# Patient Record
Sex: Female | Born: 1990 | Race: Black or African American | Hispanic: No | Marital: Single | Smoking: Never smoker
Health system: Southern US, Community
[De-identification: ages and names within clinical notes are randomized; demographics above are authoritative.]

## PROBLEM LIST (undated history)

## (undated) DIAGNOSIS — J45909 Unspecified asthma, uncomplicated: Secondary | ICD-10-CM

---
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
## 2009-01-17 ENCOUNTER — Emergency Department: Payer: Self-pay | Admitting: Emergency Medicine

## 2010-01-29 ENCOUNTER — Emergency Department: Payer: Self-pay | Admitting: Emergency Medicine

## 2010-05-29 ENCOUNTER — Emergency Department: Payer: Self-pay | Admitting: Physician Assistant

## 2011-03-03 LAB — URINALYSIS, ROUTINE W REFLEX MICROSCOPIC
Bilirubin Urine: NEGATIVE
Glucose, UA: NEGATIVE
Specific Gravity, Urine: 1.025
Urobilinogen, UA: 2 — ABNORMAL HIGH

## 2011-03-03 LAB — BASIC METABOLIC PANEL
CO2: 25
Calcium: 8.8
Creatinine, Ser: 0.82
GFR calc Af Amer: >60
GFR calc non Af Amer: >60
Sodium: 136

## 2011-03-03 LAB — DIFFERENTIAL
Basophils Relative: 2 — ABNORMAL HIGH
Lymphocytes Relative: 35
Lymphs Abs: 2
Monocytes Absolute: 0.4
Monocytes Relative: 7
Neutro Abs: 3
Neutrophils Relative %: 52

## 2011-03-03 LAB — CBC
Hemoglobin: 12.7
MCHC: 34.2
RBC: 4.22
WBC: 5.7

## 2011-03-03 LAB — WET PREP, GENITAL
Trich, Wet Prep: NONE SEEN
Yeast Wet Prep HPF POC: NONE SEEN

## 2011-03-03 LAB — URINE MICROSCOPIC-ADD ON

## 2011-03-03 LAB — GC/CHLAMYDIA PROBE AMP, GENITAL: Chlamydia, DNA Probe: NEGATIVE

## 2011-08-21 ENCOUNTER — Emergency Department: Payer: Self-pay | Admitting: Internal Medicine

## 2011-08-21 LAB — COMPREHENSIVE METABOLIC PANEL
Alkaline Phosphatase: 40 U/L — ABNORMAL LOW (ref 50–136)
BUN: 12 mg/dL (ref 7–18)
Bilirubin,Total: 0.2 mg/dL (ref 0.2–1.0)
Creatinine: 0.98 mg/dL (ref 0.60–1.30)
EGFR (African American): >60
EGFR (Non-African Amer.): >60
Glucose: 77 mg/dL (ref 65–99)
SGOT(AST): 18 U/L (ref 15–37)
SGPT (ALT): 12 U/L
Sodium: 141 mmol/L (ref 136–145)
Total Protein: 7.3 g/dL (ref 6.4–8.2)

## 2011-08-21 LAB — CBC
HGB: 12.6 g/dL (ref 12.0–16.0)
MCHC: 34.9 g/dL (ref 32.0–36.0)
MCV: 89 fL (ref 80–100)

## 2011-08-21 LAB — URINALYSIS, COMPLETE
Bilirubin,UR: NEGATIVE
Leukocyte Esterase: NEGATIVE
Nitrite: NEGATIVE
Ph: 5 (ref 4.5–8.0)
WBC UR: 2 /HPF (ref 0–5)

## 2011-08-21 LAB — PREGNANCY, URINE: Pregnancy Test, Urine: POSITIVE m[IU]/mL

## 2011-08-31 ENCOUNTER — Emergency Department: Payer: Self-pay | Admitting: Emergency Medicine

## 2011-08-31 LAB — CBC
HGB: 12.9 g/dL (ref 12.0–16.0)
MCH: 30.8 pg (ref 26.0–34.0)
MCHC: 34.8 g/dL (ref 32.0–36.0)
MCV: 89 fL (ref 80–100)
Platelet: 204 10*3/uL (ref 150–440)
RBC: 4.18 10*6/uL (ref 3.80–5.20)
RDW: 13.9 % (ref 11.5–14.5)

## 2011-08-31 LAB — URINALYSIS, COMPLETE
Bacteria: NONE SEEN
Leukocyte Esterase: NEGATIVE
Ph: 5 (ref 4.5–8.0)
RBC,UR: 1 /HPF (ref 0–5)
Squamous Epithelial: 1
WBC UR: 3 /HPF (ref 0–5)

## 2011-08-31 LAB — COMPREHENSIVE METABOLIC PANEL
Alkaline Phosphatase: 36 U/L — ABNORMAL LOW (ref 50–136)
Anion Gap: 12 (ref 7–16)
BUN: 7 mg/dL (ref 7–18)
Calcium, Total: 8.8 mg/dL (ref 8.5–10.1)
Creatinine: 0.55 mg/dL — ABNORMAL LOW (ref 0.60–1.30)
EGFR (African American): >60
Glucose: 72 mg/dL (ref 65–99)
Osmolality: 276 (ref 275–301)
Sodium: 140 mmol/L (ref 136–145)

## 2011-08-31 LAB — WET PREP, GENITAL

## 2012-12-02 ENCOUNTER — Encounter (HOSPITAL_COMMUNITY): Payer: Self-pay | Admitting: Emergency Medicine

## 2012-12-02 DIAGNOSIS — J45909 Unspecified asthma, uncomplicated: Secondary | ICD-10-CM | POA: Insufficient documentation

## 2012-12-02 DIAGNOSIS — F17200 Nicotine dependence, unspecified, uncomplicated: Secondary | ICD-10-CM | POA: Insufficient documentation

## 2012-12-02 DIAGNOSIS — L259 Unspecified contact dermatitis, unspecified cause: Secondary | ICD-10-CM | POA: Insufficient documentation

## 2012-12-02 NOTE — ED Notes (Signed)
Patient c/o rash to face since Friday; states has been using Benadryl and Claritin without relief.  No shortness of breath.

## 2012-12-03 ENCOUNTER — Emergency Department (HOSPITAL_COMMUNITY)
Admission: EM | Admit: 2012-12-03 | Discharge: 2012-12-03 | Disposition: A | Discharge status: Home / Self Care | Payer: Self-pay | Attending: Emergency Medicine | Admitting: Emergency Medicine

## 2012-12-03 DIAGNOSIS — L309 Dermatitis, unspecified: Secondary | ICD-10-CM

## 2012-12-03 HISTORY — DX: Unspecified asthma, uncomplicated: J45.909

## 2012-12-03 MED ORDER — PREDNISONE 10 MG PO TABS: ORAL_TABLET | ORAL | Status: DC

## 2012-12-03 MED ORDER — PREDNISONE 50 MG PO TABS
60.0000 mg | ORAL_TABLET | Freq: Once | ORAL | Status: AC
  Administered 2012-12-03: 60 mg via ORAL
  Filled 2012-12-03: qty 1

## 2012-12-03 MED ORDER — DIPHENHYDRAMINE HCL 25 MG PO CAPS
25.0000 mg | ORAL_CAPSULE | Freq: Once | ORAL | Status: AC
  Administered 2012-12-03: 25 mg via ORAL
  Filled 2012-12-03: qty 1

## 2012-12-03 MED ORDER — DIPHENHYDRAMINE HCL 25 MG PO CAPS: 25.0000 mg | ORAL_CAPSULE | ORAL | Status: DC | PRN

## 2012-12-03 NOTE — ED Provider Notes (Signed)
History    CSN: [id number] Arrival date & time 12/02/12  2331  First MD Initiated Contact with Patient 12/03/12 0110     Chief Complaint  Patient presents with  . Rash   (Consider location/radiation/quality/duration/timing/severity/associated sxs/prior Treatment) HPI Comments: Kaitlyn Henry is a 22 y.o. Female presenting with a 4 day history of an itchy periorbital rash.  She denies any new facial products stating she has very sensitive skin and has to be careful, including does not use makeup due to sensitivity.  Her rash started shortly after eating a steak biscuit at work, although has had this meal many times before without incident.  She denies fevers, chills,  Shortness of breath and has had no facial , mouth or throat swelling.   She has used topical benadryl and cortaid both which caused severe burning. She has found no alleviators.     The history is provided by the patient.   Past Medical History  Diagnosis Date  . Asthma    History reviewed. No pertinent past surgical history. No family history on file. History  Substance Use Topics  . Smoking status: Current Some Day Smoker  . Smokeless tobacco: Not on file  . Alcohol Use: Yes   OB History   Grav Para Term Preterm Abortions TAB SAB Ect Mult Living                 Review of Systems  Constitutional: Negative for fever and chills.  HENT: Negative for sore throat, facial swelling, mouth sores and trouble swallowing.   Respiratory: Negative for shortness of breath and wheezing.   Skin: Positive for rash.  Neurological: Negative for numbness.    Allergies  Review of patient's allergies indicates no known allergies.  Home Medications   Current Outpatient Rx  Name  Route  Sig  Dispense  Refill  . diphenhydrAMINE (BENADRYL) 25 mg capsule   Oral   Take 1 capsule (25 mg total) by mouth every 4 (four) hours as needed for itching.   30 capsule   0   . predniSONE (DELTASONE) 10 MG tablet      6, 5, 4, 3, 2  then 1 tablet by mouth daily for 6 days total.   21 tablet   0    BP 108/70  Pulse 112  Temp(Src) 98.3 F (36.8 C) (Oral)  Resp 20  Ht 5\' 4"  (1.626 m)  Wt 150 lb (68.04 kg)  BMI 25.73 kg/m2  SpO2 100%  LMP 11/01/2012 Physical Exam  Constitutional: She appears well-developed and well-nourished. No distress.  HENT:  Head: Normocephalic.  Neck: Neck supple.  Cardiovascular: Normal rate.   Pulmonary/Chest: Effort normal. She has no wheezes.  Musculoskeletal: Normal range of motion. She exhibits no edema.  Skin: Rash noted.  Dry appearing skin on chin,  Periorbital area and right cheek,  Macular,  A few areas with tiny grouped vesicles.  No erythema or edema.    ED Course  Procedures (including critical care time) Labs Reviewed - No data to display No results found. 1. Dermatitis     MDM  Probable contact dermatitis of unclear causative agent.  Pt was prescribed oral prednisone,  Suggested oral benadryl for itch as topicals caused burning.  Encouraged f/u with dermatology if sx persist,  Referral given.  The patient appears reasonably screened and/or stabilized for discharge and I doubt any other medical condition or other [facility] requiring further screening, evaluation, or treatment in the ED at this time prior to  discharge.   Burgess Amor, PA-C 12/03/12 [phone]

## 2012-12-03 NOTE — ED Provider Notes (Signed)
Medical screening examination/treatment/procedure(s) were performed by non-physician practitioner and as supervising physician I was immediately available for consultation/collaboration.  Karena Kinker S. Lillyanna Glandon, MD 12/03/12 0437 

## 2012-12-03 NOTE — ED Notes (Signed)
Patient has fine rash noted to facial area. Denies pain. Denies difficulty breathing. States it started 3 days ago.

## 2013-10-02 ENCOUNTER — Emergency Department (HOSPITAL_COMMUNITY)
Admission: EM | Admit: 2013-10-02 | Discharge: 2013-10-02 | Disposition: A | Discharge status: Home / Self Care | Payer: Self-pay | Attending: Emergency Medicine | Admitting: Emergency Medicine

## 2013-10-02 ENCOUNTER — Emergency Department (HOSPITAL_COMMUNITY): Payer: Self-pay

## 2013-10-02 ENCOUNTER — Encounter (HOSPITAL_COMMUNITY): Payer: Self-pay | Admitting: Emergency Medicine

## 2013-10-02 DIAGNOSIS — J45909 Unspecified asthma, uncomplicated: Secondary | ICD-10-CM | POA: Insufficient documentation

## 2013-10-02 DIAGNOSIS — Z87891 Personal history of nicotine dependence: Secondary | ICD-10-CM | POA: Insufficient documentation

## 2013-10-02 DIAGNOSIS — S8390XA Sprain of unspecified site of unspecified knee, initial encounter: Secondary | ICD-10-CM

## 2013-10-02 DIAGNOSIS — Y929 Unspecified place or not applicable: Secondary | ICD-10-CM | POA: Insufficient documentation

## 2013-10-02 DIAGNOSIS — W010XXA Fall on same level from slipping, tripping and stumbling without subsequent striking against object, initial encounter: Secondary | ICD-10-CM | POA: Insufficient documentation

## 2013-10-02 DIAGNOSIS — Y939 Activity, unspecified: Secondary | ICD-10-CM | POA: Insufficient documentation

## 2013-10-02 DIAGNOSIS — IMO0002 Reserved for concepts with insufficient information to code with codable children: Secondary | ICD-10-CM | POA: Insufficient documentation

## 2013-10-02 MED ORDER — NAPROXEN 500 MG PO TABS: 500.0000 mg | ORAL_TABLET | Freq: Two times a day (BID) | ORAL | Status: DC

## 2013-10-02 MED ORDER — HYDROCODONE-ACETAMINOPHEN 5-325 MG PO TABS: ORAL_TABLET | ORAL | Status: DC

## 2013-10-02 NOTE — ED Provider Notes (Signed)
CSN: [id number]     Arrival date & time 10/02/13  [id number] History   First MD Initiated Contact with Patient 10/02/13 [phone]     Chief Complaint  Patient presents with  . Knee Pain     (Consider location/radiation/quality/duration/timing/severity/associated sxs/prior Treatment) Patient is a 23 y.o. female presenting with knee pain. The history is provided by the patient.  Knee Pain Location:  Knee Time since incident:  2 days Injury: yes   Mechanism of injury: fall   Fall:    Fall occurred:  Tripped   Impact surface:  Hard floor   Point of impact:  Unable to specify   Entrapped after fall: no   Knee location:  R knee Pain details:    Quality:  Tingling and sharp   Radiates to:  Does not radiate   Severity:  Moderate   Onset quality:  Sudden   Timing:  Constant   Progression:  Unchanged Chronicity:  New Dislocation: no   Foreign body present:  No foreign bodies Prior injury to area:  No Relieved by:  Nothing Worsened by:  Activity and bearing weight Ineffective treatments:  NSAIDs Associated symptoms: no back pain, no decreased ROM, no fever, no neck pain, no numbness, no stiffness, no swelling and no tingling     Past Medical History  Diagnosis Date  . Asthma    History reviewed. No pertinent past surgical history. No family history on file. History  Substance Use Topics  . Smoking status: Former Games developermoker  . Smokeless tobacco: Not on file  . Alcohol Use: No   OB History   Grav Para Term Preterm Abortions TAB SAB Ect Mult Living                 Review of Systems  Constitutional: Negative for fever and chills.  Genitourinary: Negative for dysuria and difficulty urinating.  Musculoskeletal: Positive for arthralgias. Negative for back pain, joint swelling, neck pain and stiffness.       Right knee pain  Skin: Negative for color change and wound.  All other systems reviewed and are negative.     Allergies  Review of patient's allergies indicates no known  allergies.  Home Medications   Prior to Admission medications   Medication Sig Start Date End Date Taking? Authorizing Provider  diphenhydrAMINE (BENADRYL) 25 mg capsule Take 1 capsule (25 mg total) by mouth every 4 (four) hours as needed for itching. 12/03/12   Burgess AmorJulie Idol, PA-C  predniSONE (DELTASONE) 10 MG tablet 6, 5, 4, 3, 2 then 1 tablet by mouth daily for 6 days total. 12/03/12   Burgess AmorJulie Idol, PA-C   BP 103/77  Pulse 82  Temp(Src) 98.2 F (36.8 C)  Resp 16  Ht 5\' 5"  (1.651 m)  Wt 145 lb (65.772 kg)  BMI 24.13 kg/m2  SpO2 96%  LMP 09/18/2013 Physical Exam  Nursing note and vitals reviewed. Constitutional: She is oriented to person, place, and time. She appears well-developed and well-nourished. No distress.  HENT:  Head: Normocephalic and atraumatic.  Neck: Normal range of motion. Neck supple.  Cardiovascular: Normal rate, regular rhythm, normal heart sounds and intact distal pulses.   Pulmonary/Chest: Effort normal and breath sounds normal. No respiratory distress.  Musculoskeletal: Normal range of motion. She exhibits tenderness. She exhibits no edema.  ttp of the distal right knee.  No erythema, effusion, or step-off deformity.  DP pulse brisk, distal sensation intact. Calf is soft and NT.  Compartments soft  Neurological: She is alert and oriented  to person, place, and time. She exhibits normal muscle tone. Coordination normal.  Skin: Skin is warm and dry. No erythema.    ED Course  Procedures (including critical care time) Labs Review Labs Reviewed - No data to display  Imaging Review Dg Knee Complete 4 Views Right  10/02/2013   CLINICAL DATA:  Fall.  EXAM: RIGHT KNEE - COMPLETE 4+ VIEW  COMPARISON:  None.  FINDINGS: No acute bony or joint abnormality identified. No evidence of fracture or dislocation.  IMPRESSION: No acute abnormality.   Electronically Signed   By: Maisie Fushomas  Register   On: 10/02/2013 09:13     EKG Interpretation None      MDM   Final diagnoses:   Knee sprain    XR reviewed, no concerning sx's for septic joint.  No effusion or STS of the knee.  Pt has full ROM of the knee.  She agrees to symptomatic treatment with RICE therapy and close orthopedic f/u in one week if sx's not improving.   Pt appears stable for discharge and agrees to plan.      Malachi Kinzler L. Trisha Mangleriplett, PA-C 10/03/13 1647

## 2013-10-02 NOTE — ED Notes (Addendum)
PT d/c to home with NAD. Right knee 19" immobilizer fitted and placed on pt with medium crutches with demonstration instructions and teach back used.

## 2013-10-02 NOTE — ED Notes (Addendum)
PT c/o right knee pain into calf from fall 2 days ago. No redness/swelling or deformity noted. Pt ambulated from triage to room.

## 2013-10-06 NOTE — ED Provider Notes (Signed)
Medical screening examination/treatment/procedure(s) were performed by non-physician practitioner and as supervising physician I was immediately available for consultation/collaboration.   EKG Interpretation None        Benny LennertJoseph L Charnell Peplinski, MD 10/06/13 1513

## 2014-02-21 ENCOUNTER — Encounter (HOSPITAL_COMMUNITY): Payer: Self-pay | Admitting: Emergency Medicine

## 2014-02-21 ENCOUNTER — Emergency Department (HOSPITAL_COMMUNITY)
Admission: EM | Admit: 2014-02-21 | Discharge: 2014-02-21 | Disposition: A | Discharge status: Home / Self Care | Payer: Self-pay | Attending: Emergency Medicine | Admitting: Emergency Medicine

## 2014-02-21 ENCOUNTER — Emergency Department (HOSPITAL_COMMUNITY): Payer: Self-pay

## 2014-02-21 DIAGNOSIS — M25519 Pain in unspecified shoulder: Secondary | ICD-10-CM | POA: Insufficient documentation

## 2014-02-21 DIAGNOSIS — R52 Pain, unspecified: Secondary | ICD-10-CM | POA: Insufficient documentation

## 2014-02-21 DIAGNOSIS — Z87891 Personal history of nicotine dependence: Secondary | ICD-10-CM | POA: Insufficient documentation

## 2014-02-21 DIAGNOSIS — Z791 Long term (current) use of non-steroidal anti-inflammatories (NSAID): Secondary | ICD-10-CM | POA: Insufficient documentation

## 2014-02-21 DIAGNOSIS — M25511 Pain in right shoulder: Secondary | ICD-10-CM

## 2014-02-21 DIAGNOSIS — J45909 Unspecified asthma, uncomplicated: Secondary | ICD-10-CM | POA: Insufficient documentation

## 2014-02-21 MED ORDER — TRAMADOL HCL 50 MG PO TABS: 50.0000 mg | ORAL_TABLET | Freq: Four times a day (QID) | ORAL | Status: DC | PRN

## 2014-02-21 MED ORDER — NAPROXEN 375 MG PO TABS: 375.0000 mg | ORAL_TABLET | Freq: Two times a day (BID) | ORAL | Status: DC

## 2014-02-21 MED ORDER — CYCLOBENZAPRINE HCL 10 MG PO TABS
10.0000 mg | ORAL_TABLET | Freq: Once | ORAL | Status: AC
  Administered 2014-02-21: 10 mg via ORAL
  Filled 2014-02-21: qty 1

## 2014-02-21 NOTE — ED Notes (Signed)
Pt reports right shoulder pain that radiates down the right arm. Pt states she unsure of any injury.

## 2014-02-21 NOTE — Discharge Instructions (Signed)
Your x-ray today normal. Take the medication as directed. Do not take the narcotic if you are driving because it will make you sleepy.

## 2014-02-21 NOTE — ED Provider Notes (Signed)
CSN: [id number]     Arrival date & time 02/21/14  1325 History   First MD Initiated Contact with Patient 02/21/14 1405     Chief Complaint  Patient presents with  . Shoulder Pain     (Consider location/radiation/quality/duration/timing/severity/associated sxs/prior Treatment) Patient is a 23 y.o. female presenting with shoulder pain. The history is provided by the patient.  Shoulder Pain This is a new problem. The current episode started yesterday. The problem occurs constantly. The problem has been gradually worsening. Exacerbated by: movement of the arm. She has tried NSAIDs for the symptoms. The treatment provided mild relief.   SYMPHANIE CEDERBERG is a 23 y.o. female who presents to the ED with right should pain that started yesterday while she was at work. She states she works at [employer] but is required to do other task that involves lifting up to 50 pounds. She does remember lifting the large container of tea prior to having the pain.  Past Medical History  Diagnosis Date  . Asthma    History reviewed. No pertinent past surgical history. No family history on file. History  Substance Use Topics  . Smoking status: Former Games developer  . Smokeless tobacco: Not on file  . Alcohol Use: No   OB History   Grav Para Term Preterm Abortions TAB SAB Ect Mult Living                 Review of Systems Negative except as stated in HPI   Allergies  Review of patient's allergies indicates no known allergies.  Home Medications   Prior to Admission medications   Medication Sig Start Date End Date Taking? Authorizing Provider  ibuprofen (ADVIL,MOTRIN) 200 MG tablet Take 600 mg by mouth every 8 (eight) hours as needed for moderate pain.    Yes Historical Provider, MD   BP 110/72  Pulse 76  Temp(Src) 98.7 F (37.1 C) (Oral)  Resp 16  Wt 140 lb (63.504 kg)  SpO2 100%  LMP 01/07/2014 Physical Exam  Nursing note and vitals reviewed. Constitutional: She is oriented to person,  place, and time. She appears well-developed and well-nourished. No distress.  HENT:  Head: Normocephalic and atraumatic.  Eyes: EOM are normal.  Neck: Neck supple.  Cardiovascular: Normal rate.   Pulmonary/Chest: Effort normal.  Abdominal: Soft. There is no tenderness.  Musculoskeletal:       Right shoulder: She exhibits tenderness. She exhibits normal range of motion, no swelling, no effusion, no crepitus, no deformity, no laceration, no spasm, normal pulse and normal strength.  Pain to the anterior aspect of the right shoulder with palpation and butting arm behind back.   Neurological: She is alert and oriented to person, place, and time. No cranial nerve deficit.  Skin: Skin is warm and dry.  Psychiatric: She has a normal mood and affect. Her behavior is normal.    ED Course  Procedures Dg Shoulder Right  02/21/2014   CLINICAL DATA:  Right arm tingling sensation. Possible pulled muscle. No known injury.  EXAM: RIGHT SHOULDER - 2+ VIEW  COMPARISON:  None.  FINDINGS: There is no evidence of fracture or dislocation. There is no evidence of arthropathy or other focal bone abnormality. Soft tissues are unremarkable.  IMPRESSION: Normal examination.   Electronically Signed   By: Gordan Payment M.D.   On: 02/21/2014 14:54    MDM  23 y.o. female with right shoulder pain after lifting heavy tea container at work. Stable for discharge without neurovascular deficits. Discussed  with the patient clinical and x-ray findings and all questioned fully answered. She will follow up with ortho or return here if any problems arise.   Medication List    TAKE these medications       naproxen 375 MG tablet  Commonly known as:  NAPROSYN  Take 1 tablet (375 mg total) by mouth 2 (two) times daily.     traMADol 50 MG tablet  Commonly known as:  ULTRAM  Take 1 tablet (50 mg total) by mouth every 6 (six) hours as needed.      ASK your doctor about these medications       ibuprofen 200 MG tablet  Commonly  known as:  ADVIL,MOTRIN  Take 600 mg by mouth every 8 (eight) hours as needed for moderate pain.           [place], [place] 02/22/14 2150

## 2014-02-23 NOTE — ED Provider Notes (Signed)
Medical screening examination/treatment/procedure(s) were performed by non-physician practitioner and as supervising physician I was immediately available for consultation/collaboration.   EKG Interpretation None        Srihaan Mastrangelo L Jezebelle Ledwell, MD 02/23/14 0829 

## 2014-05-18 ENCOUNTER — Encounter (HOSPITAL_COMMUNITY): Payer: Self-pay | Admitting: Emergency Medicine

## 2014-05-18 ENCOUNTER — Emergency Department (HOSPITAL_COMMUNITY)
Admission: EM | Admit: 2014-05-18 | Discharge: 2014-05-18 | Disposition: A | Discharge status: Home / Self Care | Payer: Self-pay | Attending: Emergency Medicine | Admitting: Emergency Medicine

## 2014-05-18 ENCOUNTER — Emergency Department (HOSPITAL_COMMUNITY): Payer: Self-pay

## 2014-05-18 DIAGNOSIS — N760 Acute vaginitis: Secondary | ICD-10-CM | POA: Insufficient documentation

## 2014-05-18 DIAGNOSIS — N8329 Other ovarian cysts: Secondary | ICD-10-CM | POA: Insufficient documentation

## 2014-05-18 DIAGNOSIS — B9689 Other specified bacterial agents as the cause of diseases classified elsewhere: Secondary | ICD-10-CM

## 2014-05-18 DIAGNOSIS — A599 Trichomoniasis, unspecified: Secondary | ICD-10-CM

## 2014-05-18 DIAGNOSIS — R102 Pelvic and perineal pain: Secondary | ICD-10-CM | POA: Insufficient documentation

## 2014-05-18 DIAGNOSIS — J45909 Unspecified asthma, uncomplicated: Secondary | ICD-10-CM | POA: Insufficient documentation

## 2014-05-18 DIAGNOSIS — A5901 Trichomonal vulvovaginitis: Secondary | ICD-10-CM | POA: Insufficient documentation

## 2014-05-18 DIAGNOSIS — Z791 Long term (current) use of non-steroidal anti-inflammatories (NSAID): Secondary | ICD-10-CM | POA: Insufficient documentation

## 2014-05-18 DIAGNOSIS — Z79899 Other long term (current) drug therapy: Secondary | ICD-10-CM | POA: Insufficient documentation

## 2014-05-18 DIAGNOSIS — Z87891 Personal history of nicotine dependence: Secondary | ICD-10-CM | POA: Insufficient documentation

## 2014-05-18 DIAGNOSIS — Z3202 Encounter for pregnancy test, result negative: Secondary | ICD-10-CM | POA: Insufficient documentation

## 2014-05-18 DIAGNOSIS — N83201 Unspecified ovarian cyst, right side: Secondary | ICD-10-CM

## 2014-05-18 LAB — CBC WITH DIFFERENTIAL/PLATELET
BASOS ABS: 0 10*3/uL (ref 0.0–0.1)
Basophils Relative: 0 % (ref 0–1)
Eosinophils Absolute: 0 10*3/uL (ref 0.0–0.7)
Eosinophils Relative: 0 % (ref 0–5)
HEMATOCRIT: 37.3 % (ref 36.0–46.0)
Hemoglobin: 13.2 g/dL (ref 12.0–15.0)
LYMPHS PCT: 24 % (ref 12–46)
Lymphs Abs: 1.5 10*3/uL (ref 0.7–4.0)
MCH: 29.9 pg (ref 26.0–34.0)
MCHC: 35.4 g/dL (ref 30.0–36.0)
MCV: 84.6 fL (ref 78.0–100.0)
MONO ABS: 0.4 10*3/uL (ref 0.1–1.0)
Monocytes Relative: 6 % (ref 3–12)
NEUTROS ABS: 4.4 10*3/uL (ref 1.7–7.7)
NEUTROS PCT: 70 % (ref 43–77)
Platelets: 232 10*3/uL (ref 150–400)
RBC: 4.41 MIL/uL (ref 3.87–5.11)
RDW: 13 % (ref 11.5–15.5)
WBC: 6.3 10*3/uL (ref 4.0–10.5)

## 2014-05-18 LAB — URINALYSIS, ROUTINE W REFLEX MICROSCOPIC
Bilirubin Urine: NEGATIVE
GLUCOSE, UA: NEGATIVE mg/dL
HGB URINE DIPSTICK: NEGATIVE
Ketones, ur: NEGATIVE mg/dL
LEUKOCYTES UA: NEGATIVE
Nitrite: NEGATIVE
Protein, ur: NEGATIVE mg/dL
UROBILINOGEN UA: 0.2 mg/dL (ref 0.0–1.0)
pH: 5.5 (ref 5.0–8.0)

## 2014-05-18 LAB — PREGNANCY, URINE: PREG TEST UR: NEGATIVE

## 2014-05-18 LAB — WET PREP, GENITAL: YEAST WET PREP: NONE SEEN

## 2014-05-18 LAB — RPR

## 2014-05-18 LAB — HIV ANTIBODY (ROUTINE TESTING W REFLEX): HIV 1&2 Ab, 4th Generation: NONREACTIVE

## 2014-05-18 MED ORDER — NAPROXEN SODIUM 550 MG PO TABS: 550.0000 mg | ORAL_TABLET | Freq: Two times a day (BID) | ORAL | Status: DC

## 2014-05-18 MED ORDER — METRONIDAZOLE 500 MG PO TABS: 500.0000 mg | ORAL_TABLET | Freq: Two times a day (BID) | ORAL | Status: DC

## 2014-05-18 MED ORDER — HYDROCODONE-ACETAMINOPHEN 5-325 MG PO TABS: 1.0000 | ORAL_TABLET | ORAL | Status: DC | PRN

## 2014-05-18 NOTE — ED Notes (Signed)
US called, they are coming to do US Pelvis and Transvaginal now.

## 2014-05-18 NOTE — ED Notes (Signed)
Pt ready for pelvic exam. MD notified by tech.

## 2014-05-18 NOTE — ED Notes (Signed)
Mid lower abdominal pain that started last night. No n/v/d.

## 2014-05-18 NOTE — ED Provider Notes (Signed)
CSN: [id number]     Arrival date & time 05/18/14  0813 History   First MD Initiated Contact with Patient 05/18/14 [phone]     Chief Complaint  Patient presents with  . Abdominal Pain     (Consider location/radiation/quality/duration/timing/severity/associated sxs/prior Treatment) Patient is a 23 y.o. female presenting with abdominal pain. The history is provided by the patient.  Abdominal Pain Pain location:  RLQ and LLQ Pain quality: sharp and stabbing   Pain radiates to:  Does not radiate Pain severity:  Moderate Onset quality:  Sudden Duration:  1 day Timing:  Constant Progression:  Worsening Chronicity:  New Relieved by:  Nothing Worsened by:  Movement Ineffective treatments:  None tried Associated symptoms: chills   Associated symptoms: no chest pain, no constipation, no cough, no diarrhea, no dysuria, no fever, no nausea, no shortness of breath, no vaginal bleeding, no vaginal discharge and no vomiting    Kaitlyn Henry is a 23 y.o. G1 P0 who is not pregnant. She had an abortion 2 years ago. She has not been sexually active since that time. No hx of STI's. She presents to the ED with lower abdominal pain that started yesterday and has gotten worse. Hx of ovarian cyst 3 years ago.   Past Medical History  Diagnosis Date  . Asthma    History reviewed. No pertinent past surgical history. History reviewed. No pertinent family history. History  Substance Use Topics  . Smoking status: Former Games developermoker  . Smokeless tobacco: Not on file  . Alcohol Use: No   OB History    No data available     Review of Systems  Constitutional: Positive for chills. Negative for fever.  HENT: Negative.   Eyes: Negative for pain, discharge, itching and visual disturbance.  Respiratory: Negative for cough, shortness of breath and wheezing.   Cardiovascular: Negative for chest pain and palpitations.  Gastrointestinal: Positive for abdominal pain. Negative for nausea, vomiting, diarrhea and  constipation.  Genitourinary: Negative for dysuria, urgency, frequency, vaginal bleeding and vaginal discharge.  Musculoskeletal: Negative for back pain and neck pain.  Skin: Negative for rash.  Neurological: Negative for seizures, syncope and headaches.  Psychiatric/Behavioral: Negative for confusion. The patient is not nervous/anxious.       Allergies  Review of patient's allergies indicates no known allergies.  Home Medications   Prior to Admission medications   Medication Sig Start Date End Date Taking? Authorizing Provider  HYDROcodone-acetaminophen (NORCO/VICODIN) 5-325 MG per tablet Take 1 tablet by mouth every 4 (four) hours as needed. 05/18/14   Hope Orlene OchM Neese, NP  metroNIDAZOLE (FLAGYL) 500 MG tablet Take 1 tablet (500 mg total) by mouth 2 (two) times daily. 05/18/14   Hope Orlene OchM Neese, NP  naproxen sodium (ANAPROX DS) 550 MG tablet Take 1 tablet (550 mg total) by mouth 2 (two) times daily with a meal. 05/18/14   Hope Orlene OchM Neese, NP   BP 100/69 mmHg  Pulse 67  Temp(Src) 98.5 F (36.9 C) (Oral)  Resp 18  Ht 5\' 4"  (1.626 m)  Wt 150 lb (68.04 kg)  BMI 25.73 kg/m2  SpO2 100%  LMP 05/04/2014 Physical Exam  Constitutional: She is oriented to person, place, and time. She appears well-developed and well-nourished. No distress.  HENT:  Head: Normocephalic.  Eyes: EOM are normal.  Neck: Neck supple.  Cardiovascular: Normal rate and regular rhythm.   Pulmonary/Chest: Effort normal and breath sounds normal.  Abdominal: Soft. Bowel sounds are normal. There is tenderness in the right lower quadrant.  There is no rigidity, no rebound, no guarding and no CVA tenderness.  Genitourinary:  External genitalia without lesions, frothy discharge vaginal vault, no CMT, right adnexal tenderness. Uterus without palpable enlargement.   Musculoskeletal: Normal range of motion.  Neurological: She is alert and oriented to person, place, and time. No cranial nerve deficit.  Skin: Skin is warm and dry.   Psychiatric: She has a normal mood and affect. Her behavior is normal.  Nursing note and vitals reviewed.   ED Course  Procedures (including critical care time) Labs Review Results for orders placed or performed during the hospital encounter of 05/18/14 (from the past 24 hour(s))  Urinalysis, Routine w reflex microscopic     Status: Abnormal   Collection Time: 05/18/14  8:36 AM  Result Value Ref Range   Color, Urine YELLOW YELLOW   APPearance CLEAR CLEAR   Specific Gravity, Urine >1.030 (H) 1.005 - 1.030   pH 5.5 5.0 - 8.0   Glucose, UA NEGATIVE NEGATIVE mg/dL   Hgb urine dipstick NEGATIVE NEGATIVE   Bilirubin Urine NEGATIVE NEGATIVE   Ketones, ur NEGATIVE NEGATIVE mg/dL   Protein, ur NEGATIVE NEGATIVE mg/dL   Urobilinogen, UA 0.2 0.0 - 1.0 mg/dL   Nitrite NEGATIVE NEGATIVE   Leukocytes, UA NEGATIVE NEGATIVE  Pregnancy, urine     Status: None   Collection Time: 05/18/14  9:23 AM  Result Value Ref Range   Preg Test, Ur NEGATIVE NEGATIVE  CBC with Differential     Status: None   Collection Time: 05/18/14  9:57 AM  Result Value Ref Range   WBC 6.3 4.0 - 10.5 K/uL   RBC 4.41 3.87 - 5.11 MIL/uL   Hemoglobin 13.2 12.0 - 15.0 g/dL   HCT 16.1 09.6 - 04.5 %   MCV 84.6 78.0 - 100.0 fL   MCH 29.9 26.0 - 34.0 pg   MCHC 35.4 30.0 - 36.0 g/dL   RDW 40.9 81.1 - 91.4 %   Platelets 232 150 - 400 K/uL   Neutrophils Relative % 70 43 - 77 %   Neutro Abs 4.4 1.7 - 7.7 K/uL   Lymphocytes Relative 24 12 - 46 %   Lymphs Abs 1.5 0.7 - 4.0 K/uL   Monocytes Relative 6 3 - 12 %   Monocytes Absolute 0.4 0.1 - 1.0 K/uL   Eosinophils Relative 0 0 - 5 %   Eosinophils Absolute 0.0 0.0 - 0.7 K/uL   Basophils Relative 0 0 - 1 %   Basophils Absolute 0.0 0.0 - 0.1 K/uL  Wet prep, genital     Status: Abnormal   Collection Time: 05/18/14 10:12 AM  Result Value Ref Range   Yeast Wet Prep HPF POC NONE SEEN NONE SEEN   Trich, Wet Prep MANY (A) NONE SEEN   Clue Cells Wet Prep HPF POC MANY (A) NONE SEEN    WBC, Wet Prep HPF POC MANY (A) NONE SEEN    US Transvaginal Non-ob  05/18/2014   CLINICAL DATA:  Lower abdominal pain since last night.  EXAM: TRANSABDOMINAL AND TRANSVAGINAL ULTRASOUND OF PELVIS  TECHNIQUE: Both transabdominal and transvaginal ultrasound examinations of the pelvis were performed. Transabdominal technique was performed for global imaging of the pelvis including uterus, ovaries, adnexal regions, and pelvic cul-de-sac. It was necessary to proceed with endovaginal exam following the transabdominal exam to visualize the uterus and ovaries.  COMPARISON:  None  FINDINGS: Uterus  Measurements: 8.2 x 3.9 x 4.7 cm. Uterus is retroverted.  No mass.  Endometrium  Thickness:  12.6 mm.  No focal abnormality visualized.  Right ovary  Measurements: 6.3 0.3 x 4.5. 4.0 x 2.1 x 3.2 cm complex cyst most likely hemorrhagic cyst.  Left ovary  Measurements: 2.7 x 1.9 x 2.0 cm. Normal appearance/no adnexal mass.  Other findings  Moderate amount of free pelvic fluid.  IMPRESSION: 1. For cm complex cyst right ovary, most likely a hemorrhagic cyst. This is almost certainly benign, and no specific imaging follow up is recommended according to the Society of Radiologists in Ultrasound2010 Consensus Conference Statement (D Lenis NoonLevine et al. Management of Asymptomatic Ovarian and Other Adnexal Cysts Imaged at US: Society of Radiologists in Ultrasound Consensus Conference Statement 2010. Radiology 256 (Sept 2010): 943-954.). 2. Mild amount of free pelvic fluid.   Electronically Signed   By: Maisie Fushomas  Register   On: 05/18/2014 11:25   [place] Pelvis Complete  05/18/2014   CLINICAL DATA:  Lower abdominal pain since last night.  EXAM: TRANSABDOMINAL AND TRANSVAGINAL ULTRASOUND OF PELVIS  TECHNIQUE: Both transabdominal and transvaginal ultrasound examinations of the pelvis were performed. Transabdominal technique was performed for global imaging of the pelvis including uterus, ovaries, adnexal regions, and pelvic cul-de-sac. It was  necessary to proceed with endovaginal exam following the transabdominal exam to visualize the uterus and ovaries.  COMPARISON:  None  FINDINGS: Uterus  Measurements: 8.2 x 3.9 x 4.7 cm. Uterus is retroverted.  No mass.  Endometrium  Thickness: 12.6 mm.  No focal abnormality visualized.  Right ovary  Measurements: 6.3 0.3 x 4.5. 4.0 x 2.1 x 3.2 cm complex cyst most likely hemorrhagic cyst.  Left ovary  Measurements: 2.7 x 1.9 x 2.0 cm. Normal appearance/no adnexal mass.  Other findings  Moderate amount of free pelvic fluid.  IMPRESSION: 1. For cm complex cyst right ovary, most likely a hemorrhagic cyst. This is almost certainly benign, and no specific imaging follow up is recommended according to the Society of Radiologists in Ultrasound2010 Consensus Conference Statement (D Lenis NoonLevine et al. Management of Asymptomatic Ovarian and Other Adnexal Cysts Imaged at US: Society of Radiologists in Ultrasound Consensus Conference Statement 2010. Radiology 256 (Sept 2010): 943-954.). 2. Mild amount of free pelvic fluid.   Electronically Signed   By: Maisie Fushomas  Register   On: 05/18/2014 11:25    MDM  23 y.o. female with pelvic pain that started suddenly last night. Stable for discharge without torsion or acute abdomen. Will treat for hemorrhagic cyst, trichomonas infection and BV. Patient to follow up with the health department. I discussed with the patient STI's and partner treatment. Cultures for GC and Chlamydia pending, HIV and RPR pending.    Medication List    STOP taking these medications        naproxen 375 MG tablet  Commonly known as:  NAPROSYN     traMADol 50 MG tablet  Commonly known as:  ULTRAM      TAKE these medications        HYDROcodone-acetaminophen 5-325 MG per tablet  Commonly known as:  NORCO/VICODIN  Take 1 tablet by mouth every 4 (four) hours as needed.     metroNIDAZOLE 500 MG tablet  Commonly known as:  FLAGYL  Take 1 tablet (500 mg total) by mouth 2 (two) times daily.     naproxen  sodium 550 MG tablet  Commonly known as:  ANAPROX DS  Take 1 tablet (550 mg total) by mouth 2 (two) times daily with a meal.        Final diagnoses:  Pelvic pain in female  Ovarian cyst, right  Trichomonas infection  Bacterial vaginosis        Janne Napoleon, NP 05/18/14 1156  Benny Lennert, MD 05/20/14 [phone]

## 2014-05-18 NOTE — ED Notes (Signed)
Pt states she has hx of ovarian cysts and this feels similar to the pain she felt before.

## 2014-05-19 LAB — GC/CHLAMYDIA PROBE AMP
CT PROBE, AMP APTIMA: NEGATIVE
GC PROBE AMP APTIMA: NEGATIVE

## 2014-05-27 ENCOUNTER — Encounter (HOSPITAL_COMMUNITY): Payer: Self-pay | Admitting: *Deleted

## 2014-05-27 ENCOUNTER — Emergency Department (HOSPITAL_COMMUNITY)
Admission: EM | Admit: 2014-05-27 | Discharge: 2014-05-27 | Discharge status: Against Medical Advice | Payer: Self-pay | Attending: Emergency Medicine | Admitting: Emergency Medicine

## 2014-05-27 DIAGNOSIS — J45909 Unspecified asthma, uncomplicated: Secondary | ICD-10-CM | POA: Insufficient documentation

## 2014-05-27 DIAGNOSIS — R109 Unspecified abdominal pain: Secondary | ICD-10-CM | POA: Insufficient documentation

## 2014-05-27 NOTE — ED Notes (Signed)
Called for pt, pr no longer in waiting room

## 2014-05-27 NOTE — ED Notes (Signed)
Pt with continued abd pain since cyst appeared, was seen on 12/14 for it and prescribed antibiotics and per pt has been taking as presribed, pt denies N/V/D

## 2014-05-27 NOTE — ED Notes (Signed)
Pt no longer in waiting room 

## 2015-02-07 ENCOUNTER — Encounter (HOSPITAL_COMMUNITY): Payer: Self-pay

## 2015-02-07 ENCOUNTER — Emergency Department (HOSPITAL_COMMUNITY)
Admission: EM | Admit: 2015-02-07 | Discharge: 2015-02-07 | Disposition: A | Discharge status: Home / Self Care | Payer: Self-pay | Attending: Emergency Medicine | Admitting: Emergency Medicine

## 2015-02-07 DIAGNOSIS — Z79899 Other long term (current) drug therapy: Secondary | ICD-10-CM | POA: Insufficient documentation

## 2015-02-07 DIAGNOSIS — J4520 Mild intermittent asthma, uncomplicated: Secondary | ICD-10-CM

## 2015-02-07 DIAGNOSIS — Z87891 Personal history of nicotine dependence: Secondary | ICD-10-CM | POA: Insufficient documentation

## 2015-02-07 DIAGNOSIS — R05 Cough: Secondary | ICD-10-CM

## 2015-02-07 DIAGNOSIS — J4521 Mild intermittent asthma with (acute) exacerbation: Secondary | ICD-10-CM | POA: Insufficient documentation

## 2015-02-07 DIAGNOSIS — R059 Cough, unspecified: Secondary | ICD-10-CM

## 2015-02-07 MED ORDER — ALBUTEROL SULFATE HFA 108 (90 BASE) MCG/ACT IN AERS
2.0000 | INHALATION_SPRAY | Freq: Once | RESPIRATORY_TRACT | Status: AC
  Administered 2015-02-07: 2 via RESPIRATORY_TRACT
  Filled 2015-02-07: qty 6.7

## 2015-02-07 MED ORDER — GUAIFENESIN-CODEINE 100-10 MG/5ML PO SYRP: 10.0000 mL | ORAL_SOLUTION | Freq: Three times a day (TID) | ORAL | Status: DC | PRN

## 2015-02-07 MED ORDER — PREDNISONE 20 MG PO TABS: 40.0000 mg | ORAL_TABLET | Freq: Every day | ORAL | Status: DC

## 2015-02-07 NOTE — ED Notes (Signed)
Pt c/o congestion and body aches x 2 days.  Unknown if has had fever.

## 2015-02-07 NOTE — Discharge Instructions (Signed)
Cough, Adult   A cough is a reflex. It helps you clear your throat and airways. A cough can help heal your body. A cough can last 2 or 3 weeks (acute) or may last more than 8 weeks (chronic). Some common causes of a cough can include an infection, allergy, or a cold.  HOME CARE  · Only take medicine as told by your doctor.  · If given, take your medicines (antibiotics) as told. Finish them even if you start to feel better.  · Use a cold steam vaporizer or humidifier in your home. This can help loosen thick spit (secretions).  · Sleep so you are almost sitting up (semi-upright). Use pillows to do this. This helps reduce coughing.  · Rest as needed.  · Stop smoking if you smoke.  GET HELP RIGHT AWAY IF:  · You have yellowish-white fluid (pus) in your thick spit.  · Your cough gets worse.  · Your medicine does not reduce coughing, and you are losing sleep.  · You cough up blood.  · You have trouble breathing.  · Your pain gets worse and medicine does not help.  · You have a fever.  MAKE SURE YOU:   · Understand these instructions.  · Will watch your condition.  · Will get help right away if you are not doing well or get worse.  Document Released: 02/02/2011 Document Revised: 10/06/2013 Document Reviewed: 02/02/2011  [employer]® Patient Information ©2015 [employer]. This information is not intended to replace advice given to you by your health care provider. Make sure you discuss any questions you have with your health care provider.

## 2015-02-09 NOTE — ED Provider Notes (Signed)
CSN: [id number]     Arrival date & time 02/07/15  0810 History   First MD Initiated Contact with Patient 02/07/15 [phone]     Chief Complaint  Patient presents with  . Nasal Congestion     (Consider location/radiation/quality/duration/timing/severity/associated sxs/prior Treatment) HPI  Kaitlyn Henry is a 24 y.o. female who presents to the Emergency Department complaining of cough, wheezing and nasal congestion for 2 days.  She also reports having generalized body aches.  Notes having sweats and chills, but unsure if she's had a fever.  Cough mostly non-productive.  Intermittent wheezing and notes hx of asthma that typically flares with cold symptoms.  She denies vomiting, chest pain, sore throat or shortness of breath.   Past Medical History  Diagnosis Date  . Asthma    History reviewed. No pertinent past surgical history. No family history on file. Social History  Substance Use Topics  . Smoking status: Former Games developer  . Smokeless tobacco: None  . Alcohol Use: No   OB History    No data available     Review of Systems  Constitutional: Negative for fever, chills, activity change and appetite change.  HENT: Positive for congestion and rhinorrhea. Negative for facial swelling, sore throat and trouble swallowing.   Eyes: Negative for visual disturbance.  Respiratory: Positive for cough and wheezing. Negative for shortness of breath and stridor.   Gastrointestinal: Negative for nausea, vomiting and abdominal pain.  Genitourinary: Negative for dysuria and flank pain.  Musculoskeletal: Positive for myalgias. Negative for arthralgias, neck pain and neck stiffness.  Skin: Negative.   Neurological: Negative for dizziness, weakness, numbness and headaches.  Hematological: Negative for adenopathy.  Psychiatric/Behavioral: Negative for confusion.  All other systems reviewed and are negative.     Allergies  Review of patient's allergies indicates no known allergies.  Home  Medications   Prior to Admission medications   Medication Sig Start Date End Date Taking? Authorizing Provider  pseudoephedrine (SUDAFED) 30 MG tablet Take 30 mg by mouth every 4 (four) hours as needed for congestion.   Yes Historical Provider, MD  guaiFENesin-codeine (ROBITUSSIN AC) 100-10 MG/5ML syrup Take 10 mLs by mouth 3 (three) times daily as needed. 02/07/15   Hansel Devan, PA-C  HYDROcodone-acetaminophen (NORCO/VICODIN) 5-325 MG per tablet Take 1 tablet by mouth every 4 (four) hours as needed. 05/18/14   Hope Orlene Och, NP  metroNIDAZOLE (FLAGYL) 500 MG tablet Take 1 tablet (500 mg total) by mouth 2 (two) times daily. 05/18/14   Hope Orlene Och, NP  naproxen sodium (ANAPROX DS) 550 MG tablet Take 1 tablet (550 mg total) by mouth 2 (two) times daily with a meal. 05/18/14   Hope Orlene Och, NP  predniSONE (DELTASONE) 20 MG tablet Take 2 tablets (40 mg total) by mouth daily. For 5 days 02/07/15   Samuella Rasool, PA-C   BP 121/85 mmHg  Pulse 62  Temp(Src) 98.1 F (36.7 C) (Oral)  Resp 16  Ht  (1.651 m)  Wt 140 lb (63.504 kg)  BMI 23.30 kg/m2  SpO2 100%  LMP 01/27/2015 Physical Exam  Constitutional: She is oriented to person, place, and time. She appears well-developed and well-nourished. No distress.  HENT:  Head: Normocephalic and atraumatic.  Right Ear: Tympanic membrane and ear canal normal.  Left Ear: Tympanic membrane and ear canal normal.  Nose: Mucosal edema and rhinorrhea present.  Mouth/Throat: Uvula is midline and mucous membranes are normal. No trismus in the jaw. No uvula swelling. Posterior oropharyngeal erythema present. No  oropharyngeal exudate, posterior oropharyngeal edema or tonsillar abscesses.  Eyes: Conjunctivae are normal.  Neck: Normal range of motion and phonation normal. Neck supple. No Brudzinski's sign and no Kernig's sign noted.  Cardiovascular: Normal rate, regular rhythm, normal heart sounds and intact distal pulses.   No murmur heard. Pulmonary/Chest:  Effort normal. No respiratory distress. She has wheezes. She has no rales.  Abdominal: Soft. She exhibits no distension. There is no tenderness. There is no rebound and no guarding.  Musculoskeletal: She exhibits no edema.  Lymphadenopathy:    She has no cervical adenopathy.  Neurological: She is alert and oriented to person, place, and time. She exhibits normal muscle tone. Coordination normal.  Skin: Skin is warm and dry.  Nursing note and vitals reviewed.   ED Course  Procedures (including critical care time) Labs Review Labs Reviewed - No data to display    EKG Interpretation None      MDM   Final diagnoses:  Cough  Asthma, mild intermittent, uncomplicated    Pt well appearing, non-toxic.  Vitals stable.  Wheezing improved after albuterol.  Pt appears stable for d/c and agrees to symptomatic tx and close PMD f/u.  No tachycardia, hypoxia, or tachypnea.     Pauline Aus, PA-C 02/09/15 2206  Lavera Guise, MD 02/10/15 [phone]

## 2016-02-27 IMAGING — CR DG SHOULDER 2+V*R*
3 series · 3 of 3 positions shown · non-contrast
Comparison: None.

CLINICAL DATA: Right arm tingling sensation. Possible pulled
muscle. No known injury.

EXAM:
RIGHT SHOULDER - 2+ VIEW

[view not recorded (1 of 3)]
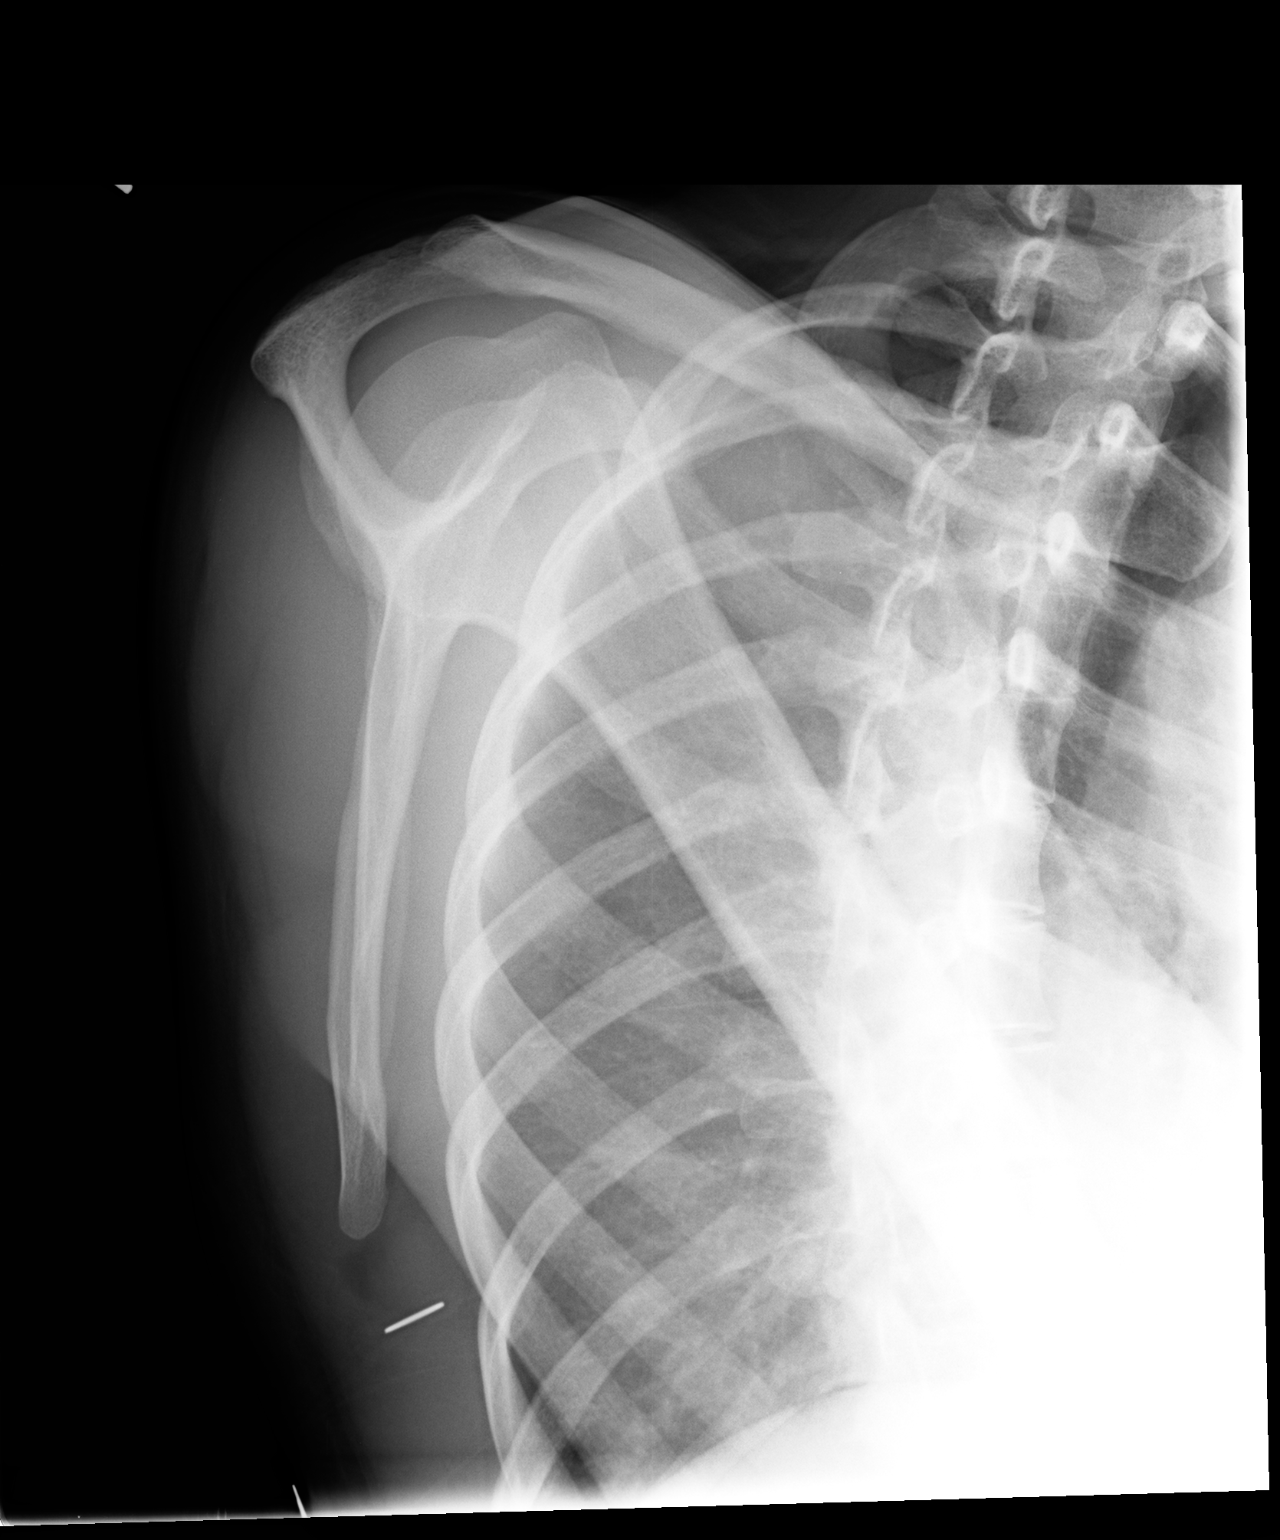

[view not recorded (2 of 3)]
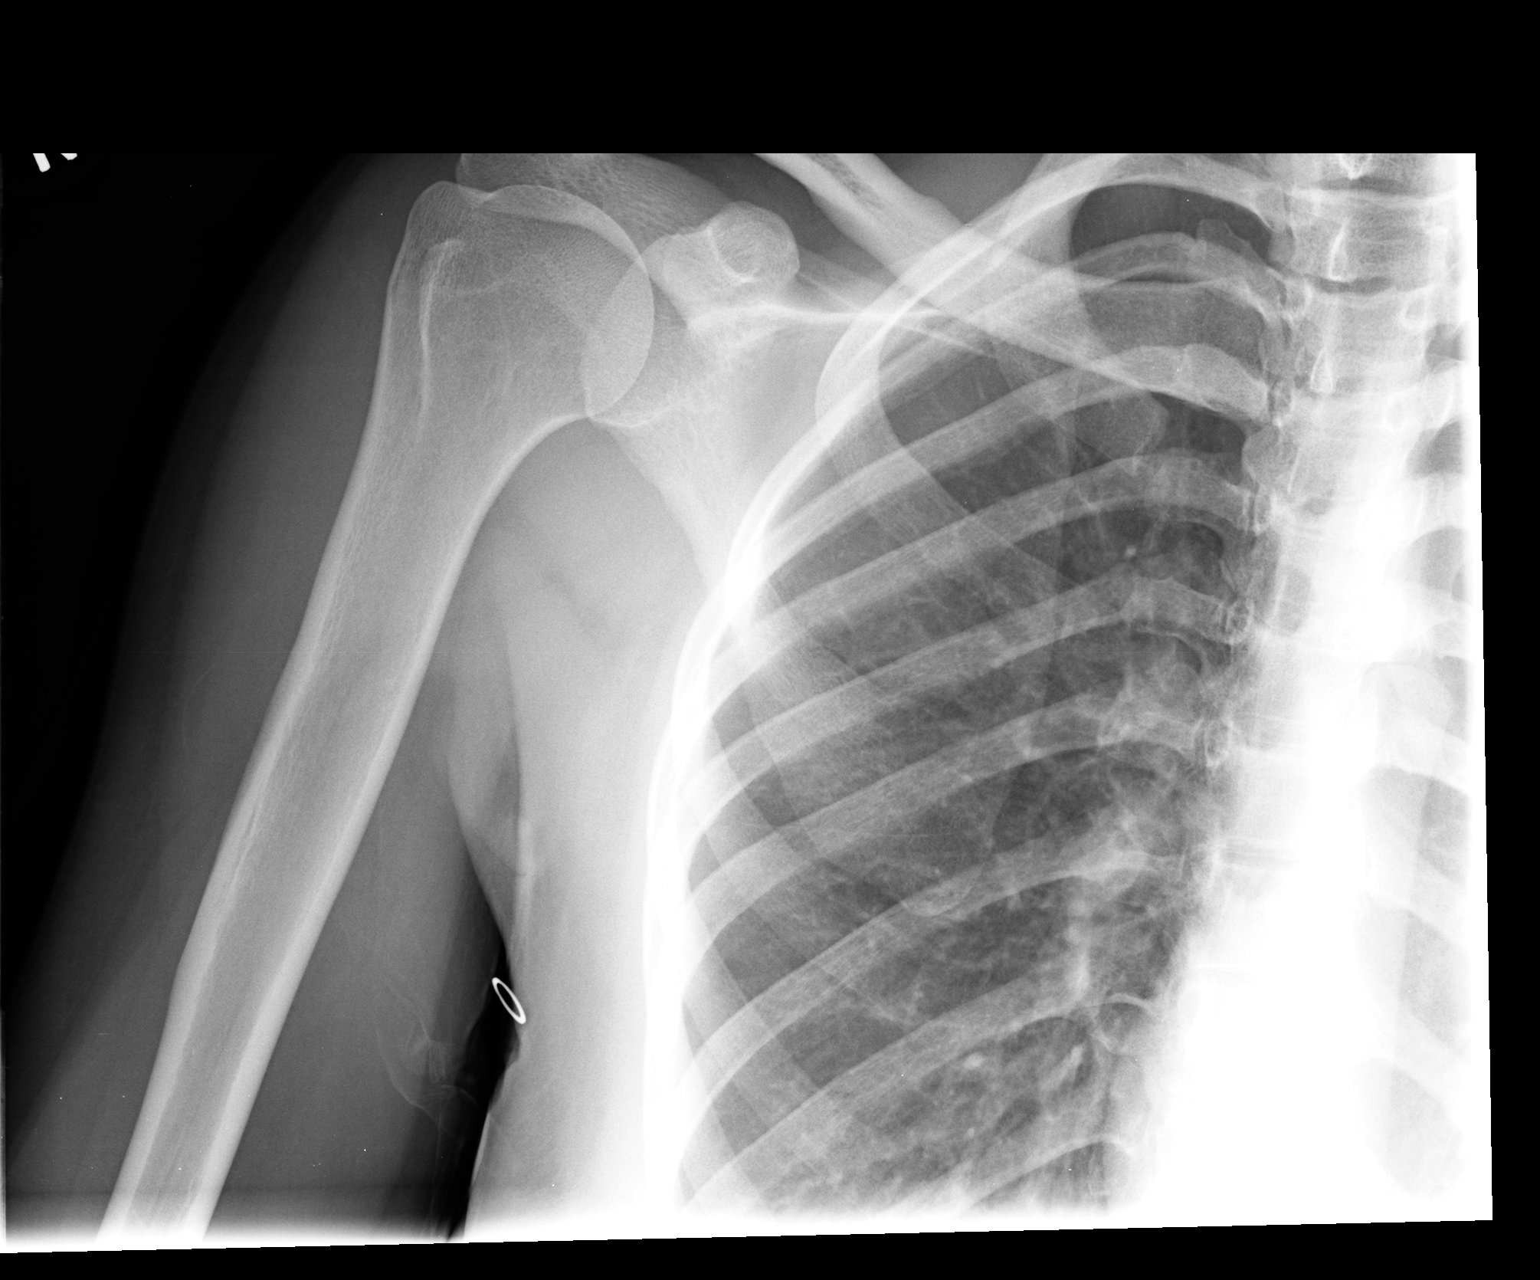

[view not recorded (3 of 3)]
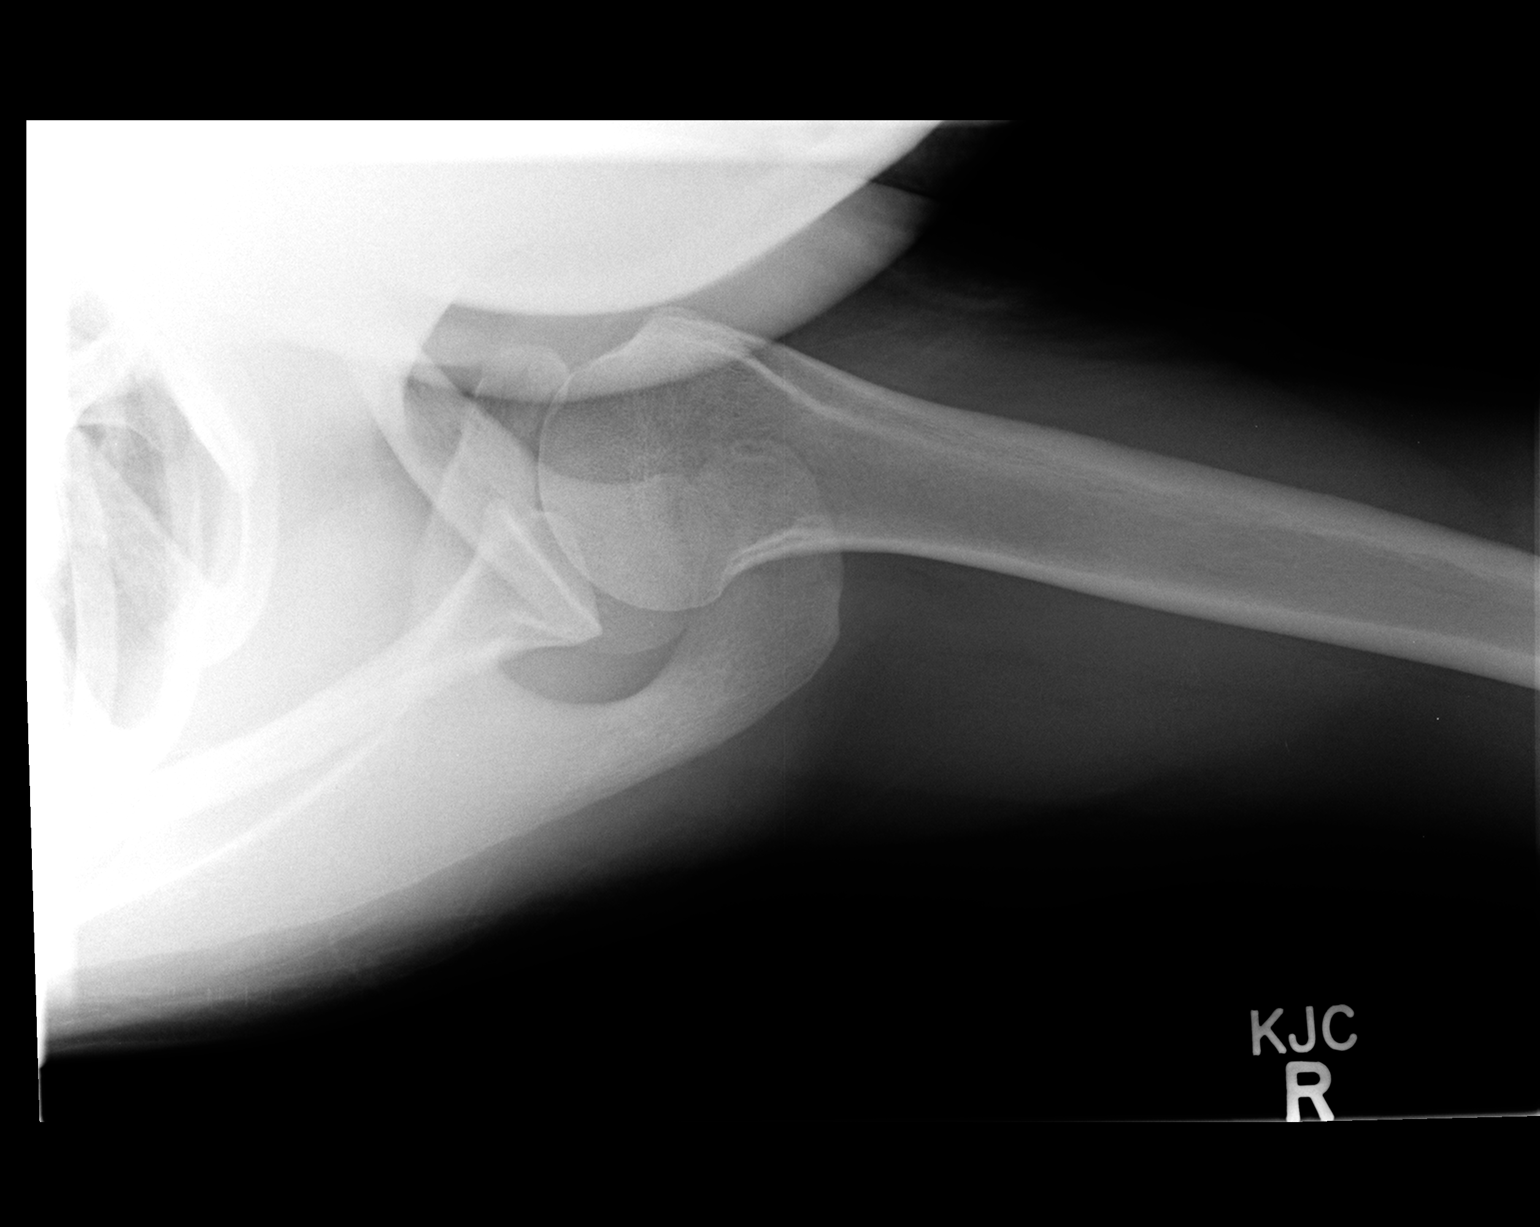

[3 of 3 positions shown; findings below may reference images not displayed]

FINDINGS: There is no evidence of fracture or dislocation. There is no
evidence of arthropathy or other focal bone abnormality. Soft
tissues are unremarkable.
IMPRESSION: Normal examination.

## 2017-09-08 ENCOUNTER — Encounter (HOSPITAL_COMMUNITY): Payer: Self-pay | Admitting: Emergency Medicine

## 2017-09-08 ENCOUNTER — Other Ambulatory Visit: Payer: Self-pay

## 2017-09-08 ENCOUNTER — Emergency Department (HOSPITAL_COMMUNITY)
Admission: EM | Admit: 2017-09-08 | Discharge: 2017-09-08 | Disposition: A | Discharge status: Home / Self Care | Payer: No Typology Code available for payment source | Attending: Emergency Medicine | Admitting: Emergency Medicine

## 2017-09-08 DIAGNOSIS — J069 Acute upper respiratory infection, unspecified: Secondary | ICD-10-CM | POA: Insufficient documentation

## 2017-09-08 DIAGNOSIS — R0981 Nasal congestion: Secondary | ICD-10-CM | POA: Diagnosis present

## 2017-09-08 DIAGNOSIS — Z87891 Personal history of nicotine dependence: Secondary | ICD-10-CM | POA: Diagnosis not present

## 2017-09-08 DIAGNOSIS — J45909 Unspecified asthma, uncomplicated: Secondary | ICD-10-CM | POA: Diagnosis not present

## 2017-09-08 MED ORDER — FLUTICASONE PROPIONATE 50 MCG/ACT NA SUSP: 2.0000 | Freq: Every day | NASAL | 2 refills | Status: AC

## 2017-09-08 NOTE — ED Triage Notes (Signed)
Pt reports waking up with pressure in face, scratchy throat, and feeling cold.  Slight cough.

## 2017-09-08 NOTE — ED Provider Notes (Signed)
[facility] Provider Note   CSN: [id number] Arrival date & time: 09/08/17  [id number]     History   Chief Complaint Chief Complaint  Patient presents with  . Nasal Congestion    HPI Kaitlyn Henry is a 27 y.o. female.  The patient is an otherwise healthy 27 year old female who reports that she has developed the onset of some upper respiratory symptoms this morning.  This is similar to what her coworkers have.  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  She has some mild myalgias with runny nose congestion and a scratchy throat but no coughing.  She is otherwise healthy, distant history of asthma but rarely uses her inhaler.  He does not have a fever today  The history is provided by the patient.  URI   The current episode started 1 to 2 hours ago. The problem has not changed since onset.There has been no fever. The fever has been present for less than 1 day. Associated symptoms include congestion, headaches, rhinorrhea and sore throat. Pertinent negatives include no chest pain, no abdominal pain, no diarrhea, no nausea, no vomiting, no dysuria, no ear pain, no sinus pain, no sneezing, no swollen glands, no joint pain, no joint swelling, no neck pain, no cough, no rash and no wheezing. She has tried nothing for the symptoms.    Past Medical History:  Diagnosis Date  . Asthma     There are no active problems to display for this patient.   History reviewed. No pertinent surgical history.   OB History   None      Home Medications    Prior to Admission medications   Medication Sig Start Date End Date Taking? Authorizing Provider  fluticasone (FLONASE) 50 MCG/ACT nasal spray Place 2 sprays into both nostrils daily. 09/08/17   Eber HongMiller, Liset Mcmonigle, MD  pseudoephedrine (SUDAFED) 30 MG tablet Take 30 mg by mouth every 4 (four) hours as needed for congestion.    [provider]    Family History History reviewed. No  pertinent family history.  Social History Social History   Tobacco Use  . Smoking status: Former Smoker  Substance Use Topics  . Alcohol use: No  . Drug use: No     Allergies   Patient has no known allergies.   Review of Systems Review of Systems  HENT: Positive for congestion, rhinorrhea and sore throat. Negative for ear pain, sinus pain and sneezing.   Respiratory: Negative for cough and wheezing.   Cardiovascular: Negative for chest pain.  Gastrointestinal: Negative for abdominal pain, diarrhea, nausea and vomiting.  Genitourinary: Negative for dysuria.  Musculoskeletal: Negative for joint pain and neck pain.  Skin: Negative for rash.  Neurological: Positive for headaches.     Physical Exam Updated Vital Signs BP 113/79 (BP Location: Right Arm)   Pulse 92   Temp 97.8 F (36.6 C) (Oral)   Resp 16   Ht 5\' 5"  (1.651 m)   Wt 66.2 kg (146 lb)   LMP 09/04/2017   SpO2 100%   BMI 24.30 kg/m   Physical Exam  Constitutional: She appears well-developed and well-nourished.  HENT:  Head: Normocephalic and atraumatic.  Clear rhinorrhea present left greater than right nostril, oropharynx with mild erythema but no exudate asymmetry or hypertrophy, bilateral tympanic membranes are translucent without any effusions or redness.  Dentition is normal, mucous membranes are moist, normal phonation  Eyes: Conjunctivae are normal. Right eye exhibits no  discharge. Left eye exhibits no discharge.  Neck:  No lymphadenopathy of the neck  Pulmonary/Chest: Effort normal. No respiratory distress.  Neurological: She is alert. Coordination normal.  Skin: Skin is warm and dry. No rash noted. She is not diaphoretic. No erythema.  Psychiatric: She has a normal mood and affect.  Nursing note and vitals reviewed.    ED Treatments / Results  Labs (all labs ordered are listed, but only abnormal results are displayed) Labs Reviewed - No data to display  EKG None  Radiology No results  found.  Procedures Procedures (including critical care time)  Medications Ordered in ED Medications - No data to display   Initial Impression / Assessment and Plan / ED Course  I have reviewed the triage vital signs and the nursing notes.  Pertinent labs & imaging results that were available during my care of the patient were reviewed by me and considered in my medical decision making (see chart for details).    Well-appearing, mild viral upper respiratory infection, possibly early flu but patient is very healthy without any significant disease, distant history of asthma however I do not feel that the patient would benefit from Tamiflu given her relatively mild symptoms and lack of a fever.  Vitals:   09/08/17 0825 09/08/17 0826  BP: 113/79   Pulse: 92   Resp: 16   Temp: 97.8 F (36.6 C)   TempSrc: Oral   SpO2: 100%   Weight:  66.2 kg (146 lb)  Height:  5\' 5"  (1.651 m)     Final Clinical Impressions(s) / ED Diagnoses   Final diagnoses:  Viral upper respiratory tract infection    ED Discharge Orders        Ordered    fluticasone (FLONASE) 50 MCG/ACT nasal spray  Daily     09/08/17 0851       Eber Hong, MD 09/08/17 [phone]

## 2017-09-08 NOTE — Discharge Instructions (Signed)
Zyrtec nightly for the next 2 weeks, Flonase in the morning Sudafed as needed throughout the day for congestion or drainage.  Please stay out of work for the next 2 days as she will be contagious to others.  Please obtain all of your results from medical records or have your doctors office obtain the results - share them with your doctor - you should be seen at your doctors office in the next 2 days. Call today to arrange your follow up. Take the medications as prescribed. Please review all of the medicines and only take them if you do not have an allergy to them. Please be aware that if you are taking birth control pills, taking other prescriptions, ESPECIALLY ANTIBIOTICS may make the birth control ineffective - if this is the case, either do not engage in sexual activity or use alternative methods of birth control such as condoms until you have finished the medicine and your family doctor says it is OK to restart them. If you are on a blood thinner such as COUMADIN, be aware that any other medicine that you take may cause the coumadin to either work too much, or not enough - you should have your coumadin level rechecked in next 7 days if this is the case.  ?  It is also a possibility that you have an allergic reaction to any of the medicines that you have been prescribed - Everybody reacts differently to medications and while MOST people have no trouble with most medicines, you may have a reaction such as nausea, vomiting, rash, swelling, shortness of breath. If this is the case, please stop taking the medicine immediately and contact your physician.  ?  You should return to the ER if you develop severe or worsening symptoms.

## 2017-09-10 ENCOUNTER — Encounter (HOSPITAL_COMMUNITY): Payer: Self-pay

## 2017-09-10 ENCOUNTER — Emergency Department (HOSPITAL_COMMUNITY)
Admission: EM | Admit: 2017-09-10 | Discharge: 2017-09-10 | Disposition: A | Discharge status: Home / Self Care | Payer: No Typology Code available for payment source | Attending: Emergency Medicine | Admitting: Emergency Medicine

## 2017-09-10 ENCOUNTER — Other Ambulatory Visit: Payer: Self-pay

## 2017-09-10 DIAGNOSIS — J040 Acute laryngitis: Secondary | ICD-10-CM

## 2017-09-10 DIAGNOSIS — Z79899 Other long term (current) drug therapy: Secondary | ICD-10-CM | POA: Diagnosis not present

## 2017-09-10 DIAGNOSIS — J45909 Unspecified asthma, uncomplicated: Secondary | ICD-10-CM | POA: Diagnosis not present

## 2017-09-10 DIAGNOSIS — R490 Dysphonia: Secondary | ICD-10-CM | POA: Diagnosis present

## 2017-09-10 LAB — RAPID STREP SCREEN (MED CTR MEBANE ONLY): Streptococcus, Group A Screen (Direct): NEGATIVE

## 2017-09-10 MED ORDER — IBUPROFEN 600 MG PO TABS: 600.0000 mg | ORAL_TABLET | Freq: Four times a day (QID) | ORAL | 0 refills | Status: DC | PRN

## 2017-09-10 NOTE — Discharge Instructions (Signed)
As discussed, voice rest is the best treatment for your symptoms.  Continue your other home medicines, adding ibuprofen to help reduce the inflammation of your vocal cords.

## 2017-09-10 NOTE — ED Triage Notes (Signed)
Pt reports was diagnosed with viral respiratory infection Friday and has been taking otc allergy medication.  C/O being hoarse since yesterday.

## 2017-09-12 LAB — CULTURE, GROUP A STREP (THRC)

## 2017-09-12 NOTE — ED Provider Notes (Signed)
[facility]   CSN: [id number] Arrival date & time: 09/10/17  [id number]     History   Chief Complaint Chief Complaint  Patient presents with  . Hoarse    HPI Kaitlyn Henry is a 27 y.o. female who was seen here 2 days ago and treated for a viral uri due to sx of scratchy throat, nasal discharge and myalgias, waking today with loss of voice.  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 She was prescribed flonase nasal spray which has helped with her nasal symptoms. She denies worsened sore throat or throat swelling or fullness sensation.  She has had no treatments for this new sx.  The history is provided by the patient.    Past Medical History:  Diagnosis Date  . Asthma     There are no active problems to display for this patient.   History reviewed. No pertinent surgical history.   OB History   None      Home Medications    Prior to Admission medications   Medication Sig Start Date End Date Taking? Authorizing Provider  cetirizine (ZYRTEC) 10 MG tablet Take 10 mg by mouth daily.   Yes [provider]  fluticasone (FLONASE) 50 MCG/ACT nasal spray Place 2 sprays into both nostrils daily. 09/08/17  Yes Eber HongMiller, Brian, MD  pseudoephedrine (SUDAFED) 30 MG tablet Take 30 mg by mouth every 4 (four) hours as needed for congestion.   Yes [provider]  ibuprofen (ADVIL,MOTRIN) 600 MG tablet Take 1 tablet (600 mg total) by mouth every 6 (six) hours as needed. 09/10/17   Burgess AmorIdol, Tiara Bartoli, PA-C    Family History No family history on file.  Social History Social History   Tobacco Use  . Smoking status: Never Smoker  . Smokeless tobacco: Never Used  Substance Use Topics  . Alcohol use: No    Comment: occ  . Drug use: No     Allergies   Patient has no known allergies.   Review of Systems Review of Systems  Constitutional: Negative for chills and fever.  HENT: Positive for rhinorrhea and  voice change. Negative for congestion, ear pain, sinus pressure, sore throat and trouble swallowing.   Eyes: Negative for discharge.  Respiratory: Positive for cough. Negative for shortness of breath, wheezing and stridor.   Cardiovascular: Negative for chest pain.  Gastrointestinal: Negative for abdominal pain.  Genitourinary: Negative.      Physical Exam Updated Vital Signs BP 96/78 (BP Location: Left Arm)   Pulse 79   Temp 98 F (36.7 C) (Oral)   Resp 17   Ht 5\' 4"  (1.626 m)   Wt 66.2 kg (146 lb)   LMP 09/04/2017   SpO2 92%   BMI 25.06 kg/m   Physical Exam  Constitutional: She is oriented to person, place, and time. She appears well-developed and well-nourished.  HENT:  Head: Normocephalic and atraumatic.  Right Ear: Tympanic membrane and ear canal normal.  Left Ear: Tympanic membrane and ear canal normal.  Nose: Rhinorrhea present. No mucosal edema.  Mouth/Throat: Uvula is midline, oropharynx is clear and moist and mucous membranes are normal. No trismus in the jaw. No uvula swelling. No oropharyngeal exudate, posterior oropharyngeal edema, posterior oropharyngeal erythema or tonsillar abscesses.  Laryngitis, pt speaking in whisper.  Eyes: Conjunctivae are normal.  Cardiovascular: Normal rate and normal heart sounds.  Pulmonary/Chest: Effort normal. No respiratory distress. She has no wheezes. She has  no rales.  Abdominal: Soft. There is no tenderness.  Musculoskeletal: Normal range of motion.  Lymphadenopathy:    She has no cervical adenopathy.  No head or neck adenopathy  Neurological: She is alert and oriented to person, place, and time.  Skin: Skin is warm and dry. No rash noted.  Psychiatric: She has a normal mood and affect.     ED Treatments / Results  Labs (all labs ordered are listed, but only abnormal results are displayed) Labs Reviewed  RAPID STREP SCREEN (NOT AT [facility])  CULTURE, GROUP A STREP [facility])    EKG None  Radiology No results  found.  Procedures Procedures (including critical care time)  Medications Ordered in ED Medications - No data to display   Initial Impression / Assessment and Plan / ED Course  I have reviewed the triage vital signs and the nursing notes.  Pertinent labs & imaging results that were available during my care of the patient were reviewed by me and considered in my medical decision making (see chart for details).     Pt with viral uri, now with loss of voice. Reassurance given pt, advised ibuprofen, voice rest, warm tea/honey/lemon to soothe throat. She has no oropharynx edema or erythema, no stridor or wheeze to suggest respiratory compromise. Prn f/u anticipated.  Final Clinical Impressions(s) / ED Diagnoses   Final diagnoses:  Laryngitis    ED Discharge Orders        Ordered    ibuprofen (ADVIL,MOTRIN) 600 MG tablet  Every 6 hours PRN     09/10/17 1125       Burgess Amor, PA-C 09/12/17 1325    Samuel Jester, DO 09/14/17 [phone]

## 2017-11-19 ENCOUNTER — Emergency Department (HOSPITAL_COMMUNITY)
Admission: EM | Admit: 2017-11-19 | Discharge: 2017-11-19 | Disposition: A | Discharge status: Home / Self Care | Payer: PRIVATE HEALTH INSURANCE | Attending: Emergency Medicine | Admitting: Emergency Medicine

## 2017-11-19 ENCOUNTER — Encounter (HOSPITAL_COMMUNITY): Payer: Self-pay | Admitting: Emergency Medicine

## 2017-11-19 DIAGNOSIS — Z79899 Other long term (current) drug therapy: Secondary | ICD-10-CM | POA: Insufficient documentation

## 2017-11-19 DIAGNOSIS — S46911A Strain of unspecified muscle, fascia and tendon at shoulder and upper arm level, right arm, initial encounter: Secondary | ICD-10-CM | POA: Insufficient documentation

## 2017-11-19 DIAGNOSIS — J45909 Unspecified asthma, uncomplicated: Secondary | ICD-10-CM | POA: Insufficient documentation

## 2017-11-19 DIAGNOSIS — Y998 Other external cause status: Secondary | ICD-10-CM | POA: Diagnosis not present

## 2017-11-19 DIAGNOSIS — Y92009 Unspecified place in unspecified non-institutional (private) residence as the place of occurrence of the external cause: Secondary | ICD-10-CM | POA: Diagnosis not present

## 2017-11-19 DIAGNOSIS — X500XXA Overexertion from strenuous movement or load, initial encounter: Secondary | ICD-10-CM | POA: Insufficient documentation

## 2017-11-19 DIAGNOSIS — T148XXA Other injury of unspecified body region, initial encounter: Secondary | ICD-10-CM

## 2017-11-19 DIAGNOSIS — S4991XA Unspecified injury of right shoulder and upper arm, initial encounter: Secondary | ICD-10-CM | POA: Diagnosis present

## 2017-11-19 DIAGNOSIS — Y9389 Activity, other specified: Secondary | ICD-10-CM | POA: Diagnosis not present

## 2017-11-19 MED ORDER — CYCLOBENZAPRINE HCL 5 MG PO TABS: 5.0000 mg | ORAL_TABLET | Freq: Three times a day (TID) | ORAL | 0 refills | Status: AC | PRN

## 2017-11-19 MED ORDER — IBUPROFEN 600 MG PO TABS: 600.0000 mg | ORAL_TABLET | Freq: Four times a day (QID) | ORAL | 0 refills | Status: AC | PRN

## 2017-11-19 MED ORDER — CYCLOBENZAPRINE HCL 10 MG PO TABS
10.0000 mg | ORAL_TABLET | Freq: Once | ORAL | Status: AC
  Administered 2017-11-19: 10 mg via ORAL
  Filled 2017-11-19: qty 1

## 2017-11-19 MED ORDER — IBUPROFEN 600 MG PO TABS: 600.0000 mg | ORAL_TABLET | Freq: Four times a day (QID) | ORAL | 0 refills | Status: DC | PRN

## 2017-11-19 MED ORDER — CYCLOBENZAPRINE HCL 5 MG PO TABS: 5.0000 mg | ORAL_TABLET | Freq: Three times a day (TID) | ORAL | 0 refills | Status: DC | PRN

## 2017-11-19 MED ORDER — IBUPROFEN 800 MG PO TABS
800.0000 mg | ORAL_TABLET | Freq: Once | ORAL | Status: AC
  Administered 2017-11-19: 800 mg via ORAL
  Filled 2017-11-19: qty 1

## 2017-11-19 NOTE — Discharge Instructions (Addendum)
Take the medicines prescribed and continue using a heating pad - 20 minutes several times daily followed by gentle stretching exercises to try to "work out" this muscle can help with the pain and the tightness of this muscle. Use caution as discussed when you take the flexeril muscle relaxer- this can cause drowsiness and you may find it better to take after work hours.

## 2017-11-19 NOTE — ED Triage Notes (Signed)
Pt reports right shoulder pain with no injury for 1 week.  Pain is in trapezius muscle on right with palpable tension.

## 2017-11-19 NOTE — ED Provider Notes (Signed)
[facility] EMERGENCY DEPARTMENT Provider Note   CSN: [id number] Arrival date & time: 11/19/17  1839     History   Chief Complaint Chief Complaint  Patient presents with  . Shoulder Pain    HPI Currie Kaitlyn Henry is a 27 y.o. female presenting with a one week history of right posterior shoulder pain and tightness after moving furniture in her home.  She denies specific injury and states her pain started gradually after this lifting event.  She has used tylenol and applied heat and ice with minimal improvement in pain.  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  She denies neck pain, also no pain or weakness in her arms or hands.  The history is provided by the patient.    Past Medical History:  Diagnosis Date  . Asthma     There are no active problems to display for this patient.   History reviewed. No pertinent surgical history.   OB History   None      Home Medications    Prior to Admission medications   Medication Sig Start Date End Date Taking? Authorizing Provider  albuterol (PROVENTIL HFA;VENTOLIN HFA) 108 (90 Base) MCG/ACT inhaler Inhale 2 puffs into the lungs every 6 (six) hours as needed for wheezing or shortness of breath.    [provider]  cetirizine (ZYRTEC) 10 MG tablet Take 10 mg by mouth daily.    [provider]  cyclobenzaprine (FLEXERIL) 5 MG tablet Take 1 tablet (5 mg total) by mouth 3 (three) times daily as needed for muscle spasms. 11/19/17   Burgess AmorIdol, Fronie Holstein, PA-C  fluticasone (FLONASE) 50 MCG/ACT nasal spray Place 2 sprays into both nostrils daily. 09/08/17   Eber HongMiller, Brian, MD  ibuprofen (ADVIL,MOTRIN) 600 MG tablet Take 1 tablet (600 mg total) by mouth every 6 (six) hours as needed. 11/19/17   Rayleigh Gillyard, Raynelle FanningJulie, PA-C  pseudoephedrine (SUDAFED) 30 MG tablet Take 30 mg by mouth every 4 (four) hours as needed for congestion.    [provider]    Family  History History reviewed. No pertinent family history.  Social History Social History   Tobacco Use  . Smoking status: Never Smoker  . Smokeless tobacco: Never Used  Substance Use Topics  . Alcohol use: No    Comment: occ  . Drug use: No     Allergies   Patient has no known allergies.   Review of Systems Review of Systems  Constitutional: Negative for fever.  Musculoskeletal: Positive for myalgias. Negative for arthralgias, joint swelling, neck pain and neck stiffness.  Neurological: Negative for weakness and numbness.     Physical Exam Updated Vital Signs BP 110/80 (BP Location: Right Arm)   Pulse 77   Temp 98.6 F (37 C) (Oral)   Resp 18   Ht 5\' 5"  (1.651 m)   Wt 68 kg (150 lb)   LMP 11/02/2017   SpO2 100%   BMI 24.96 kg/m   Physical Exam  Constitutional: She appears well-developed and well-nourished.  HENT:  Head: Atraumatic.  Neck: Normal range of motion.  Cardiovascular:  Pulses equal bilaterally  Musculoskeletal: She exhibits tenderness. She exhibits no edema or deformity.       Cervical back: She exhibits bony tenderness. She exhibits normal range of motion.       Thoracic back: She exhibits no bony tenderness.       Arms: ttp with muscle spasm right  trapezius distribution.  No midline cervical or thoracic ttp.   Neurological: She is alert. She has normal strength. She displays normal reflexes. No sensory deficit.  Equal grip strength.  Skin: Skin is warm and dry.  Psychiatric: She has a normal mood and affect.     ED Treatments / Results  Labs (all labs ordered are listed, but only abnormal results are displayed) Labs Reviewed - No data to display  EKG None  Radiology No results found.  Procedures Procedures (including critical care time)  Medications Ordered in ED Medications  cyclobenzaprine (FLEXERIL) tablet 10 mg (10 mg Oral Given 11/19/17 2204)  ibuprofen (ADVIL,MOTRIN) tablet 800 mg (800 mg Oral Given 11/19/17 2204)      Initial Impression / Assessment and Plan / ED Course  I have reviewed the triage vital signs and the nursing notes.  Pertinent labs & imaging results that were available during my care of the patient were reviewed by me and considered in my medical decision making (see chart for details).     Pt with right trapezius tenderness and spasm.  Heat, flexeril, motrin.  Prn f/u, referral given for establishment of pcp.  No midline pain, no hx trauma, no weakness in extremities.   Final Clinical Impressions(s) / ED Diagnoses   Final diagnoses:  Muscle strain    ED Discharge Orders        Ordered    cyclobenzaprine (FLEXERIL) 5 MG tablet  3 times daily PRN,   Status:  Discontinued     11/19/17 2201    ibuprofen (ADVIL,MOTRIN) 600 MG tablet  Every 6 hours PRN,   Status:  Discontinued     11/19/17 2201    cyclobenzaprine (FLEXERIL) 5 MG tablet  3 times daily PRN     11/19/17 2208    ibuprofen (ADVIL,MOTRIN) 600 MG tablet  Every 6 hours PRN     11/19/17 2208       Burgess Amor, PA-C 11/19/17 2241    Long, Arlyss Repress, MD 11/20/17 1041

## 2019-06-27 ENCOUNTER — Ambulatory Visit: Payer: No Typology Code available for payment source | Attending: Internal Medicine

## 2019-06-27 ENCOUNTER — Other Ambulatory Visit: Payer: Self-pay

## 2019-06-27 DIAGNOSIS — Z20822 Contact with and (suspected) exposure to covid-19: Secondary | ICD-10-CM

## 2019-06-28 LAB — NOVEL CORONAVIRUS, NAA: SARS-CoV-2, NAA: NOT DETECTED
